# Patient Record
Sex: Female | Born: 1970 | Hispanic: Yes | Marital: Married | Smoking: Current every day smoker
Health system: Southern US, Community
[De-identification: ages and names within clinical notes are randomized; demographics above are authoritative.]

## PROBLEM LIST (undated history)

## (undated) ENCOUNTER — Ambulatory Visit: Source: Home / Self Care

## (undated) DIAGNOSIS — Z789 Other specified health status: Secondary | ICD-10-CM

## (undated) HISTORY — PX: TUBAL LIGATION: SHX77

## (undated) HISTORY — DX: Other specified health status: Z78.9

---

## 2000-01-05 HISTORY — PX: AUGMENTATION MAMMAPLASTY: SUR837

## 2019-02-09 ENCOUNTER — Ambulatory Visit: Payer: Self-pay | Attending: Internal Medicine

## 2019-02-09 DIAGNOSIS — Z20822 Contact with and (suspected) exposure to covid-19: Secondary | ICD-10-CM | POA: Insufficient documentation

## 2019-02-11 LAB — NOVEL CORONAVIRUS, NAA: SARS-CoV-2, NAA: NOT DETECTED

## 2019-03-26 ENCOUNTER — Ambulatory Visit: Payer: Self-pay | Attending: Internal Medicine

## 2019-03-26 DIAGNOSIS — Z20822 Contact with and (suspected) exposure to covid-19: Secondary | ICD-10-CM | POA: Insufficient documentation

## 2019-03-27 LAB — SARS-COV-2, NAA 2 DAY TAT

## 2019-03-27 LAB — NOVEL CORONAVIRUS, NAA: SARS-CoV-2, NAA: NOT DETECTED

## 2019-07-21 ENCOUNTER — Encounter (HOSPITAL_COMMUNITY): Payer: Self-pay

## 2019-07-21 ENCOUNTER — Ambulatory Visit (HOSPITAL_COMMUNITY)
Admission: EM | Admit: 2019-07-21 | Discharge: 2019-07-21 | Disposition: A | Discharge status: Home / Self Care | Payer: Self-pay | Attending: Urgent Care | Admitting: Urgent Care

## 2019-07-21 ENCOUNTER — Other Ambulatory Visit: Payer: Self-pay

## 2019-07-21 DIAGNOSIS — M62830 Muscle spasm of back: Secondary | ICD-10-CM

## 2019-07-21 DIAGNOSIS — M2569 Stiffness of other specified joint, not elsewhere classified: Secondary | ICD-10-CM

## 2019-07-21 DIAGNOSIS — M545 Low back pain, unspecified: Secondary | ICD-10-CM

## 2019-07-21 LAB — POCT URINALYSIS DIP (DEVICE)
Bilirubin Urine: NEGATIVE
Glucose, UA: NEGATIVE mg/dL
Hgb urine dipstick: NEGATIVE
Leukocytes,Ua: NEGATIVE
Nitrite: NEGATIVE
Protein, ur: 30 mg/dL — AB
Specific Gravity, Urine: 1.02 (ref 1.005–1.030)
Urobilinogen, UA: 0.2 mg/dL (ref 0.0–1.0)
pH: 7.5 (ref 5.0–8.0)

## 2019-07-21 MED ORDER — TIZANIDINE HCL 4 MG PO TABS: 4.0000 mg | ORAL_TABLET | Freq: Three times a day (TID) | ORAL | 0 refills | Status: DC | PRN

## 2019-07-21 MED ORDER — NAPROXEN 500 MG PO TABS: 500.0000 mg | ORAL_TABLET | Freq: Two times a day (BID) | ORAL | 0 refills | Status: DC

## 2019-07-21 NOTE — ED Triage Notes (Signed)
Pt presents with lower back pain radiates to lower abdomen. Denies trauma. States ibuprofen did not relief the pain.

## 2019-07-21 NOTE — ED Provider Notes (Signed)
[facility]   MRN: [id number] DOB: 10/29/1970  Subjective:   Peggy Mccarthy is a 49 y.o. female presenting for left lower back pain that radiates anteriorly toward her flank side.  Denies falls, trauma.  Denies fever, nausea, vomiting, dysuria, urinary frequency, cloudy urine, history kidney stone.  Denies doing a lot of heavy lifting.  Has used ibuprofen without relief.  She is not currently taking any medications and has no known food or drug allergies.  Denies past medical and surgical history.  History reviewed. No pertinent family history.  Social History   Tobacco Use  . Smoking status: Never Smoker  . Smokeless tobacco: Never Used  Substance Use Topics  . Alcohol use: Not on file  . Drug use: Never    ROS   Objective:   Vitals: BP (!) 138/98 (BP Location: Right Arm)   Pulse 98   Temp 98.4 F (36.9 C) (Oral)   Resp 17   LMP 07/18/2019 (Exact Date)   SpO2 99%   Physical Exam Constitutional:      General: She is not in acute distress.    Appearance: Normal appearance. She is well-developed. She is not ill-appearing.  HENT:     Head: Normocephalic and atraumatic.     Nose: Nose normal.     Mouth/Throat:     Mouth: Mucous membranes are moist.     Pharynx: Oropharynx is clear.  Eyes:     General: No scleral icterus.       Right eye: No discharge.        Left eye: No discharge.     Extraocular Movements: Extraocular movements intact.     Pupils: Pupils are equal, round, and reactive to light.  Cardiovascular:     Rate and Rhythm: Normal rate.  Pulmonary:     Effort: Pulmonary effort is normal.  Abdominal:     General: Bowel sounds are normal. There is no distension.     Palpations: Abdomen is soft. There is no mass.     Tenderness: There is no abdominal tenderness. There is no right CVA tenderness, left CVA tenderness, guarding or rebound.  Skin:    General: Skin is warm and dry.  Neurological:     General: No focal deficit present.      Mental Status: She is alert and oriented to person, place, and time.  Psychiatric:        Mood and Affect: Mood normal.        Behavior: Behavior normal.        Thought Content: Thought content normal.        Judgment: Judgment normal.     Results for orders placed or performed during the hospital encounter of 07/21/19 (from the past 24 hour(s))  POCT urinalysis dip (device)     Status: Abnormal   Collection Time: 07/21/19  6:53 PM  Result Value Ref Range   Glucose, UA NEGATIVE NEGATIVE mg/dL   Bilirubin Urine NEGATIVE NEGATIVE   Ketones, ur TRACE (A) NEGATIVE mg/dL   Specific Gravity, Urine 1.020 1.005 - 1.030   Hgb urine dipstick NEGATIVE NEGATIVE   pH 7.5 5.0 - 8.0   Protein, ur 30 (A) NEGATIVE mg/dL   Urobilinogen, UA 0.2 0.0 - 1.0 mg/dL   Nitrite NEGATIVE NEGATIVE   Leukocytes,Ua NEGATIVE NEGATIVE    Assessment and Plan :   PDMP not reviewed this encounter.  1. Acute left-sided low back pain without sciatica   2. Muscle spasm of back   3. Back  stiffness     Suspect musculoskeletal pain of her back.  Recommended conservative management with naproxen and tizanidine. Counseled patient on potential for adverse effects with medications prescribed/recommended today, ER and return-to-clinic precautions discussed, patient verbalized understanding.    Wallis Bamberg, [place] 07/21/19 1857

## 2019-10-20 ENCOUNTER — Other Ambulatory Visit: Payer: Self-pay

## 2019-10-20 DIAGNOSIS — Z20822 Contact with and (suspected) exposure to covid-19: Secondary | ICD-10-CM

## 2019-10-22 LAB — SARS-COV-2, NAA 2 DAY TAT

## 2019-10-22 LAB — NOVEL CORONAVIRUS, NAA: SARS-CoV-2, NAA: NOT DETECTED

## 2020-04-01 ENCOUNTER — Ambulatory Visit (INDEPENDENT_AMBULATORY_CARE_PROVIDER_SITE_OTHER): Payer: Self-pay | Admitting: Family

## 2020-04-01 ENCOUNTER — Other Ambulatory Visit: Payer: Self-pay

## 2020-04-01 ENCOUNTER — Encounter: Payer: Self-pay | Admitting: Family

## 2020-04-01 ENCOUNTER — Other Ambulatory Visit: Payer: Self-pay | Admitting: Family

## 2020-04-01 VITALS — BP 120/75 | HR 78 | Ht 60.0 in | Wt 150.8 lb

## 2020-04-01 DIAGNOSIS — M5431 Sciatica, right side: Secondary | ICD-10-CM

## 2020-04-01 DIAGNOSIS — Z716 Tobacco abuse counseling: Secondary | ICD-10-CM

## 2020-04-01 DIAGNOSIS — Z7689 Persons encountering health services in other specified circumstances: Secondary | ICD-10-CM

## 2020-04-01 MED ORDER — GABAPENTIN 100 MG PO CAPS: 100.0000 mg | ORAL_CAPSULE | Freq: Every day | ORAL | 0 refills | Status: DC

## 2020-04-01 MED ORDER — VARENICLINE TARTRATE 0.5 MG PO TABS: ORAL_TABLET | ORAL | 0 refills | Status: AC

## 2020-04-01 NOTE — Progress Notes (Signed)
Establish care Has not been to provider in approx. 6 yrs  Pt experiencing shooting nerve pain from right lower back radiates down leg

## 2020-04-01 NOTE — Progress Notes (Signed)
[facility]ae  Subjective:    Peggy Mccarthy - 50 y.o. female MRN [id number]  Date of birth: 01/21/1970  HPI  Peggy Mccarthy is to establish care. Patient has a PMH significant for sciatic nerve pain, right.   Current issues and/or concerns: 1. SCIATIC NERVE PAIN: Right leg sciatic nerve pain for years, worsening over the past few months. Described as sharp pain from right buttock to the right toe. Does not radiate to back. Right knee swelling sometimes. Standing makes worse. Standing a lot at home. Was taking Naproxen but caused stomach irritation even with food. Has not seen specialist.  2. SMOKING CESSATION: Smoking Amount: 1 pack every 2 days  Smoking Onset: 20 years ago Smoking triggers: social settings, anxiety Type of tobacco use: cigarettes Children in the house: yes Other household members who smoke: no Comments: Would like to begin Chantix   ROS per HPI    Health Maintenance:  Health Maintenance Due  Topic Date Due  . Hepatitis C Screening  Never done  . COVID-19 Vaccine (1) Never done  . HIV Screening  Never done  . TETANUS/TDAP  Never done  . PAP SMEAR-Modifier  Never done  . COLONOSCOPY (Pts 45-60yrs Insurance coverage will need to be confirmed)  Never done  . INFLUENZA VACCINE  Never done  . MAMMOGRAM  Never done    Past Medical History: Patient Active Problem List   Diagnosis Date Noted  . Sciatic nerve pain, right 04/01/2020    Social History   reports that she has been smoking cigarettes. She has smoked for the past 20.00 years. She has never used smokeless tobacco. She reports current alcohol use of about 4.0 standard drinks of alcohol per week. She reports that she does not use drugs.   Family History  family history is not on file.   Medications: reviewed and updated   Objective:   Physical Exam BP 120/75 (BP Location: Left Arm, Patient Position: Sitting)   Pulse 78   Ht 5' (1.524 m)   Wt 150 lb 12.8 oz (68.4 kg)   SpO2 98%   BMI 29.45 kg/m   Physical Exam HENT:     Head: Normocephalic and atraumatic.  Eyes:     Extraocular Movements: Extraocular movements intact.     Conjunctiva/sclera: Conjunctivae normal.     Pupils: Pupils are equal, round, and reactive to light.  Cardiovascular:     Rate and Rhythm: Normal rate and regular rhythm.     Pulses: Normal pulses.     Heart sounds: Normal heart sounds.  Pulmonary:     Effort: Pulmonary effort is normal.     Breath sounds: Normal breath sounds.  Musculoskeletal:     Cervical back: Normal range of motion and neck supple.  Neurological:     General: No focal deficit present.     Mental Status: She is alert and oriented to person, place, and time.  Psychiatric:        Mood and Affect: Mood normal.        Behavior: Behavior normal.      Assessment & Plan:  1. Encounter to establish care: - Patient presents today to establish care.  - Return for annual physical examination, labs, and health maintenance. Arrive fasting meaning having had no food and/or nothing to drink for at least 8 hours prior to appointment.  Please take scheduled medications as normal.  2. Sciatic nerve pain, right: - Gabapentin as prescribed. May cause drowsiness. Counseled patient to not consume if operating heavy  machinery or driving. Counseled patient to not consume with alcohol. - Follow-up with primary provider as scheduled. - gabapentin (NEURONTIN) 100 MG capsule; Take 1 capsule (100 mg total) by mouth at bedtime.  Dispense: 30 capsule; Refill: 0  3. Encounter for smoking cessation counseling: - Varenicline as prescribed. Discussed potential side effects and to notify provider and seek immediate medical attention. Patient verbalized understanding. - Follow-up with primary provider in 4 weeks or sooner if needed. - varenicline (CHANTIX) 0.5 MG tablet; Take 1 tablet (0.5 mg total) by mouth daily for 3 days, THEN 1 tablet (0.5 mg total) 2 (two) times daily for 4 days, THEN 2 tablets (1 mg total) 2  (two) times daily for 23 days.  Dispense: 103 tablet; Refill: 0   Patient was given clear instructions to go to Emergency Department or return to medical center if symptoms don't improve, worsen, or new problems develop.The patient verbalized understanding.  I discussed the assessment and treatment plan with the patient. The patient was provided an opportunity to ask questions and all were answered. The patient agreed with the plan and demonstrated an understanding of the instructions.   The patient was advised to call back or seek an in-person evaluation if the symptoms worsen or if the condition fails to improve as anticipated.    Ricky Stabs, NP 04/01/2020, 1:59 PM Primary Care at [facility]

## 2020-04-01 NOTE — Patient Instructions (Signed)
Return for annual physical examination, labs, and health maintenance. Arrive fasting meaning having had no food and/or nothing to drink for at least 8 hours prior to appointment.  Please take scheduled medications as normal. Thank you for choosing Primary Care at [facility] for your medical home!    Peggy Mccarthy was seen by Rema Fendt, NP today.   Peggy Mccarthy's primary care provider is Rema Fendt, NP.   For the best care possible,  you should try to see Peggy Stabs, NP whenever you come to clinic.   We look forward to seeing you again soon!  If you have any questions about your visit today,  please call us at [phone]  Or feel free to reach your provider via MyChart.   Varenicline oral tablets What is this medicine? VARENICLINE (var e NI kleen) is used to help people quit smoking. It is used with a patient support program recommended by your physician. This medicine may be used for other purposes; ask your health care provider or pharmacist if you have questions. COMMON BRAND NAME(S): Chantix What should I tell my health care provider before I take this medicine? They need to know if you have any of these conditions:  heart disease  if you often drink alcohol  kidney disease  mental illness  on hemodialysis  seizures  history of stroke  suicidal thoughts, plans, or attempt; a previous suicide attempt by you or a family member  an unusual or allergic reaction to varenicline, other medicines, foods, dyes, or preservatives  pregnant or trying to get pregnant  breast-feeding How should I use this medicine? Take this medicine by mouth after eating. Take with a full glass of water. Follow the directions on the prescription label. Take your doses at regular intervals. Do not take your medicine more often than directed. There are 3 ways you can use this medicine to help you quit smoking; talk to your health care professional to decide which plan is  right for you: 1) you can choose a quit date and start this medicine 1 week before the quit date, or, 2) you can start taking this medicine before you choose a quit date, and then pick a quit date between day 8 and 35 days of treatment, or, 3) if you are not sure that you are able or willing to quit smoking right away, start taking this medicine and slowly decrease the amount you smoke as directed by your health care professional with the goal of being cigarette-free by week 12 of treatment. Stick to your plan; ask about support groups or other ways to help you remain cigarette-free. If you are motivated to quit smoking and did not succeed during a previous attempt with this medicine for reasons other than side effects, or if you returned to smoking after this treatment, speak with your health care professional about whether another course of this medicine may be right for you. A special MedGuide will be given to you by the pharmacist with each prescription and refill. Be sure to read this information carefully each time. Talk to your pediatrician regarding the use of this medicine in children. This medicine is not approved for use in children. Overdosage: If you think you have taken too much of this medicine contact a poison control center or emergency room at once. NOTE: This medicine is only for you. Do not share this medicine with others. What if I miss a dose? If you miss a dose, take it as  soon as you can. If it is almost time for your next dose, take only that dose. Do not take double or extra doses. What may interact with this medicine?  alcohol  insulin  other medicines used to help people quit smoking  theophylline  warfarin This list may not describe all possible interactions. Give your health care provider a list of all the medicines, herbs, non-prescription drugs, or dietary supplements you use. Also tell them if you smoke, drink alcohol, or use illegal drugs. Some items may  interact with your medicine. What should I watch for while using this medicine? It is okay if you do not succeed at your attempt to quit and have a cigarette. You can still continue your quit attempt and keep using this medicine as directed. Just throw away your cigarettes and get back to your quit plan. Talk to your health care provider before using other treatments to quit smoking. Using this medicine with other treatments to quit smoking may increase the risk for side effects compared to using a treatment alone. You may get drowsy or dizzy. Do not drive, use machinery, or do anything that needs mental alertness until you know how this medicine affects you. Do not stand or sit up quickly, especially if you are an older patient. This reduces the risk of dizzy or fainting spells. Decrease the number of alcoholic beverages that you drink during treatment with this medicine until you know if this medicine affects your ability to tolerate alcohol. Some people have experienced increased drunkenness (intoxication), unusual or sometimes aggressive behavior, or no memory of things that have happened (amnesia) during treatment with this medicine. Sleepwalking can happen during treatment with this medicine, and can sometimes lead to behavior that is harmful to you, other people, or property. Stop taking this medicine and tell your doctor if you start sleepwalking or have other unusual sleep-related activity. After taking this medicine, you may get up out of bed and do an activity that you do not know you are doing. The next morning, you may have no memory of this. Activities include driving a car ("sleep-driving"), making and eating food, talking on the phone, sexual activity, and sleep-walking. Serious injuries have occurred. Stop the medicine and call your doctor right away if you find out you have done any of these activities. Do not take this medicine if you have used alcohol that evening. Do not take it if you  have taken another medicine for sleep. The risk of doing these sleep-related activities is higher. Patients and their families should watch out for new or worsening depression or thoughts of suicide. Also watch out for sudden changes in feelings such as feeling anxious, agitated, panicky, irritable, hostile, aggressive, impulsive, severely restless, overly excited and hyperactive, or not being able to sleep. If this happens, call your health care professional. If you have diabetes and you quit smoking, the effects of insulin may be increased and you may need to reduce your insulin dose. Check with your doctor or health care professional about how you should adjust your insulin dose. What side effects may I notice from receiving this medicine? Side effects that you should report to your doctor or health care professional as soon as possible:  allergic reactions like skin rash, itching or hives, swelling of the face, lips, tongue, or throat  acting aggressive, being angry or violent, or acting on dangerous impulses  breathing problems  changes in emotions or moods  chest pain or chest tightness  feeling faint  or lightheaded, falls  hallucination, loss of contact with reality  mouth sores  redness, blistering, peeling or loosening of the skin, including inside the mouth  signs and symptoms of a stroke like changes in vision; confusion; trouble speaking or understanding; severe headaches; sudden numbness or weakness of the face, arm or leg; trouble walking; dizziness; loss of balance or coordination  seizures  sleepwalking  suicidal thoughts or other mood changes Side effects that usually do not require medical attention (report to your doctor or health care professional if they continue or are bothersome):  constipation  gas  headache  nausea, vomiting  strange dreams  trouble sleeping This list may not describe all possible side effects. Call your doctor for medical advice  about side effects. You may report side effects to FDA at 1-800-FDA-1088. Where should I keep my medicine? Keep out of the reach of children. Store at room temperature between 15 and 30 degrees C (59 and 86 degrees F). Throw away any unused medicine after the expiration date. NOTE: This sheet is a summary. It may not cover all possible information. If you have questions about this medicine, talk to your doctor, pharmacist, or health care provider.  2021 Elsevier/Gold Standard (2017-12-09 14:27:36)

## 2020-05-05 NOTE — Progress Notes (Signed)
Patient ID: Peggy Mccarthy, female    DOB: 1970/09/01  MRN: [id number]  CC: Annual Physical Exam  Subjective: Peggy Mccarthy is a 50 y.o. female who presents for annual physical exam. Her concerns today include:   1. SCIATIC NERVE PAIN RIGHT FOLLOW-UP: 04/01/2020: - Gabapentin as prescribed.   05/06/2020: Reports took Gabapentin for 1 day and did not like the loopy feeling. Using over-the-counter pain management supplements as needed. Pain is worsening. Requesting MRI.  2. URINARY SYMPTOMS: Concern for UTI. Has abnormal sensation at the end of urinary stream.    Patient Active Problem List   Diagnosis Date Noted  . Sciatic nerve pain, right 04/01/2020     Current Outpatient Medications on File Prior to Visit  Medication Sig Dispense Refill  . gabapentin (NEURONTIN) 100 MG capsule Take 1 capsule (100 mg total) by mouth at bedtime. 30 capsule 0   No current facility-administered medications on file prior to visit.    No Known Allergies  Social History   Socioeconomic History  . Marital status: Married    Spouse name: Not on file  . Number of children: Not on file  . Years of education: Not on file  . Highest education level: Not on file  Occupational History  . Not on file  Tobacco Use  . Smoking status: Current Every Day Smoker    Years: 20.00    Types: Cigarettes  . Smokeless tobacco: Never Used  Vaping Use  . Vaping Use: Never used  Substance and Sexual Activity  . Alcohol use: Yes    Alcohol/week: 4.0 standard drinks    Types: 4 Standard drinks or equivalent per week  . Drug use: Never  . Sexual activity: Yes    Birth control/protection: None  Other Topics Concern  . Not on file  Social History Narrative  . Not on file   Social Determinants of Health   Financial Resource Strain: Not on file  Food Insecurity: Not on file  Transportation Needs: Not on file  Physical Activity: Not on file  Stress: Not on file  Social Connections: Not on file   Intimate Partner Violence: Not on file    No family history on file.  Past Surgical History:  Procedure Laterality Date  . CESAREAN SECTION    . TUBAL LIGATION      ROS: Review of Systems Negative except as stated above  PHYSICAL EXAM: BP (!) 143/92 (BP Location: Left Arm, Patient Position: Sitting)   Pulse 73   Ht 5' (1.524 m)   Wt 150 lb 6.4 oz (68.2 kg)   SpO2 98%   BMI 29.37 kg/m   Physical Exam HENT:     Head: Normocephalic and atraumatic.     Right Ear: Tympanic membrane, ear canal and external ear normal.     Left Ear: Tympanic membrane, ear canal and external ear normal.     Nose: Nose normal.     Mouth/Throat:     Mouth: Mucous membranes are moist.     Pharynx: Oropharynx is clear.  Eyes:     Extraocular Movements: Extraocular movements intact.     Conjunctiva/sclera: Conjunctivae normal.     Pupils: Pupils are equal, round, and reactive to light.  Cardiovascular:     Rate and Rhythm: Normal rate and regular rhythm.     Pulses: Normal pulses.     Heart sounds: Normal heart sounds.  Pulmonary:     Effort: Pulmonary effort is normal.     Breath sounds: Normal  breath sounds.  Chest:     Comments: Patient declined examination. Abdominal:     General: Bowel sounds are normal.     Palpations: Abdomen is soft.  Genitourinary:    Comments: Patient declined examination.  Musculoskeletal:        General: Normal range of motion.     Cervical back: Normal range of motion and neck supple.  Skin:    General: Skin is warm and dry.     Capillary Refill: Capillary refill takes less than 2 seconds.  Neurological:     General: No focal deficit present.     Mental Status: She is alert and oriented to person, place, and time.  Psychiatric:        Mood and Affect: Mood normal.        Behavior: Behavior normal.     ASSESSMENT AND PLAN: 1. Annual physical exam: - Counseled on 150 minutes of exercise per week as tolerated, healthy eating (including decreased  daily intake of saturated fats, cholesterol, added sugars, sodium), STI prevention, and routine healthcare maintenance.  2. Screening for metabolic disorder: - CMP to check kidney function, liver function, and electrolyte balance.  - Comprehensive metabolic panel  3. Screening for deficiency anemia: - CBC to screen for anemia. - CBC  4. Diabetes mellitus screening: - Hemoglobin A1c to screen for pre-diabetes/diabetes. - Hemoglobin A1c  5. Screening cholesterol level: - Lipid panel to screen for high cholesterol.  - Lipid panel  6. Thyroid disorder screen: - TSH to check thyroid function.  - TSH+T4F+T3Free  7. Need for hepatitis C screening test: - Hepatitis C antibody to screen for hepatitis C.  - Hepatitis C Antibody  8. Encounter for screening for HIV: - HIV antibody to screen for human immunodeficiency virus.  - HIV antibody (with reflex)  9. Encounter for screening mammogram for malignant neoplasm of breast: - Referral for breast cancer screening by mammogram.  - MM Digital Screening; Future  10. Pap smear for cervical cancer screening: - Referral to Gynecology for cervical cancer screening by PAP smear.  - Ambulatory referral to Gynecology  11. Colon cancer screening: - Referral to Gastroenterology for colon cancer screening by colonoscopy. - Ambulatory referral to Gastroenterology  12. Sciatic nerve pain, right: - Gabapentin discontinued per patient preference. - Over-the-counter NSAID's such as Ibuprofen, Aleve, or Motrin for pain management. May alternate with over-the-counter Acetaminophen.  - Per patient request MRI of right leg for further evaluation and management.  - Follow-up with primary provider as scheduled. - MR FEMUR RIGHT W WO CONTRAST; Future - MR TIBIA FIBULA LEFT W WO CONTRAST; Future  13. Acute cystitis without hematuria: - Urinalysis positive for urinary tract infection. - Nitrofurantoin, Macrocrystal-Monohydrate as prescribed.  -  Follow-up with primary provider as scheduled.  - POCT URINALYSIS DIP (CLINITEK) - nitrofurantoin, macrocrystal-monohydrate, (MACROBID) 100 MG capsule; Take 1 capsule (100 mg total) by mouth 2 (two) times daily for 5 days.  Dispense: 10 capsule; Refill: 0   Patient was given the opportunity to ask questions.  Patient verbalized understanding of the plan and was able to repeat key elements of the plan. Patient was given clear instructions to go to Emergency Department or return to medical center if symptoms don't improve, worsen, or new problems develop.The patient verbalized understanding.   Orders Placed This Encounter  Procedures  . MM Digital Screening  . MR FEMUR RIGHT W WO CONTRAST  . MR TIBIA FIBULA LEFT W WO CONTRAST  . Hepatitis C Antibody  . HIV  antibody (with reflex)  . CBC  . Comprehensive metabolic panel  . Lipid panel  . TSH+T4F+T3Free  . Hemoglobin A1c  . Ambulatory referral to Gynecology  . Ambulatory referral to Gastroenterology  . POCT URINALYSIS DIP (CLINITEK)     Requested Prescriptions   Signed Prescriptions Disp Refills  . nitrofurantoin, macrocrystal-monohydrate, (MACROBID) 100 MG capsule 10 capsule 0    Sig: Take 1 capsule (100 mg total) by mouth 2 (two) times daily for 5 days.   Follow-up with primary provider as scheduled.   Rema Fendt, NP

## 2020-05-06 ENCOUNTER — Other Ambulatory Visit: Payer: Self-pay

## 2020-05-06 ENCOUNTER — Ambulatory Visit (INDEPENDENT_AMBULATORY_CARE_PROVIDER_SITE_OTHER): Payer: 59 | Admitting: Family

## 2020-05-06 VITALS — BP 143/92 | HR 73 | Ht 60.0 in | Wt 150.4 lb

## 2020-05-06 DIAGNOSIS — Z0001 Encounter for general adult medical examination with abnormal findings: Secondary | ICD-10-CM | POA: Diagnosis not present

## 2020-05-06 DIAGNOSIS — Z1211 Encounter for screening for malignant neoplasm of colon: Secondary | ICD-10-CM

## 2020-05-06 DIAGNOSIS — Z1329 Encounter for screening for other suspected endocrine disorder: Secondary | ICD-10-CM

## 2020-05-06 DIAGNOSIS — Z13228 Encounter for screening for other metabolic disorders: Secondary | ICD-10-CM | POA: Diagnosis not present

## 2020-05-06 DIAGNOSIS — N3000 Acute cystitis without hematuria: Secondary | ICD-10-CM

## 2020-05-06 DIAGNOSIS — Z130 Encounter for screening for diseases of the blood and blood-forming organs and certain disorders involving the immune mechanism: Secondary | ICD-10-CM | POA: Diagnosis not present

## 2020-05-06 DIAGNOSIS — Z0000 Encounter for general adult medical examination without abnormal findings: Secondary | ICD-10-CM

## 2020-05-06 DIAGNOSIS — Z716 Tobacco abuse counseling: Secondary | ICD-10-CM

## 2020-05-06 DIAGNOSIS — Z131 Encounter for screening for diabetes mellitus: Secondary | ICD-10-CM

## 2020-05-06 DIAGNOSIS — M5431 Sciatica, right side: Secondary | ICD-10-CM

## 2020-05-06 DIAGNOSIS — Z114 Encounter for screening for human immunodeficiency virus [HIV]: Secondary | ICD-10-CM

## 2020-05-06 DIAGNOSIS — Z1231 Encounter for screening mammogram for malignant neoplasm of breast: Secondary | ICD-10-CM

## 2020-05-06 DIAGNOSIS — Z124 Encounter for screening for malignant neoplasm of cervix: Secondary | ICD-10-CM

## 2020-05-06 DIAGNOSIS — Z1159 Encounter for screening for other viral diseases: Secondary | ICD-10-CM

## 2020-05-06 DIAGNOSIS — Z13220 Encounter for screening for lipoid disorders: Secondary | ICD-10-CM

## 2020-05-06 LAB — POCT URINALYSIS DIP (CLINITEK)
Bilirubin, UA: NEGATIVE
Glucose, UA: NEGATIVE mg/dL
Ketones, POC UA: NEGATIVE mg/dL
Nitrite, UA: POSITIVE — AB
Spec Grav, UA: 1.025 (ref 1.010–1.025)
Urobilinogen, UA: 0.2 E.U./dL
pH, UA: 6 (ref 5.0–8.0)

## 2020-05-06 MED ORDER — NITROFURANTOIN MONOHYD MACRO 100 MG PO CAPS: 100.0000 mg | ORAL_CAPSULE | Freq: Two times a day (BID) | ORAL | 0 refills | Status: AC

## 2020-05-06 NOTE — Progress Notes (Signed)
Physical Pt request MRI for sciatic nerve pain on right side Pt thinks she has UTI

## 2020-05-06 NOTE — Patient Instructions (Signed)
 Annual physical exam and labs today.   Preventive Care 40-50 Years Old, Female Preventive care refers to lifestyle choices and visits with your health care provider that can promote health and wellness. This includes:  A yearly physical exam. This is also called an annual wellness visit.  Regular dental and eye exams.  Immunizations.  Screening for certain conditions.  Healthy lifestyle choices, such as: ? Eating a healthy diet. ? Getting regular exercise. ? Not using drugs or products that contain nicotine and tobacco. ? Limiting alcohol use. What can I expect for my preventive care visit? Physical exam Your health care provider will check your:  Height and weight. These may be used to calculate your BMI (body mass index). BMI is a measurement that tells if you are at a healthy weight.  Heart rate and blood pressure.  Body temperature.  Skin for abnormal spots. Counseling Your health care provider may ask you questions about your:  Past medical problems.  Family's medical history.  Alcohol, tobacco, and drug use.  Emotional well-being.  Home life and relationship well-being.  Sexual activity.  Diet, exercise, and sleep habits.  Work and work environment.  Access to firearms.  Method of birth control.  Menstrual cycle.  Pregnancy history. What immunizations do I need? Vaccines are usually given at various ages, according to a schedule. Your health care provider will recommend vaccines for you based on your age, medical history, and lifestyle or other factors, such as travel or where you work.   What tests do I need? Blood tests  Lipid and cholesterol levels. These may be checked every 5 years, or more often if you are over 50 years old.  Hepatitis C test.  Hepatitis B test. Screening  Lung cancer screening. You may have this screening every year starting at age 55 if you have a 30-pack-year history of smoking and currently smoke or have quit within  the past 15 years.  Colorectal cancer screening. ? All adults should have this screening starting at age 50 and continuing until age 75. ? Your health care provider may recommend screening at age 45 if you are at increased risk. ? You will have tests every 1-10 years, depending on your results and the type of screening test.  Diabetes screening. ? This is done by checking your blood sugar (glucose) after you have not eaten for a while (fasting). ? You may have this done every 1-3 years.  Mammogram. ? This may be done every 1-2 years. ? Talk with your health care provider about when you should start having regular mammograms. This may depend on whether you have a family history of breast cancer.  BRCA-related cancer screening. This may be done if you have a family history of breast, ovarian, tubal, or peritoneal cancers.  Pelvic exam and Pap test. ? This may be done every 3 years starting at age 21. ? Starting at age 30, this may be done every 5 years if you have a Pap test in combination with an HPV test. Other tests  STD (sexually transmitted disease) testing, if you are at risk.  Bone density scan. This is done to screen for osteoporosis. You may have this scan if you are at high risk for osteoporosis. Talk with your health care provider about your test results, treatment options, and if necessary, the need for more tests. Follow these instructions at home: Eating and drinking  Eat a diet that includes fresh fruits and vegetables, whole grains, lean protein, and   low-fat dairy products.  Take vitamin and mineral supplements as recommended by your health care provider.  Do not drink alcohol if: ? Your health care provider tells you not to drink. ? You are pregnant, may be pregnant, or are planning to become pregnant.  If you drink alcohol: ? Limit how much you have to 0-1 drink a day. ? Be aware of how much alcohol is in your drink. In the [place] one drink equals one 12 oz  bottle of beer (355 mL), one 5 oz glass of wine (148 mL), or one 1 oz glass of hard liquor (44 mL).   Lifestyle  Take daily care of your teeth and gums. Brush your teeth every morning and night with fluoride toothpaste. Floss one time each day.  Stay active. Exercise for at least 30 minutes 5 or more days each week.  Do not use any products that contain nicotine or tobacco, such as cigarettes, e-cigarettes, and chewing tobacco. If you need help quitting, ask your health care provider.  Do not use drugs.  If you are sexually active, practice safe sex. Use a condom or other form of protection to prevent STIs (sexually transmitted infections).  If you do not wish to become pregnant, use a form of birth control. If you plan to become pregnant, see your health care provider for a prepregnancy visit.  If told by your health care provider, take low-dose aspirin daily starting at age 50.  Find healthy ways to cope with stress, such as: ? Meditation, yoga, or listening to music. ? Journaling. ? Talking to a trusted person. ? Spending time with friends and family. Safety  Always wear your seat belt while driving or riding in a vehicle.  Do not drive: ? If you have been drinking alcohol. Do not ride with someone who has been drinking. ? When you are tired or distracted. ? While texting.  Wear a helmet and other protective equipment during sports activities.  If you have firearms in your house, make sure you follow all gun safety procedures. What's next?  Visit your health care provider once a year for an annual wellness visit.  Ask your health care provider how often you should have your eyes and teeth checked.  Stay up to date on all vaccines. This information is not intended to replace advice given to you by your health care provider. Make sure you discuss any questions you have with your health care provider. Document Revised: 09/25/2019 Document Reviewed: 09/01/2017 Elsevier  Patient Education  2021 Elsevier Inc.  

## 2020-05-07 LAB — CBC
Hematocrit: 37.9 % (ref 34.0–46.6)
Hemoglobin: 13.2 g/dL (ref 11.1–15.9)
MCH: 34.4 pg — ABNORMAL HIGH (ref 26.6–33.0)
MCHC: 34.8 g/dL (ref 31.5–35.7)
MCV: 99 fL — ABNORMAL HIGH (ref 79–97)
Platelets: 369 10*3/uL (ref 150–450)
RBC: 3.84 x10E6/uL (ref 3.77–5.28)
RDW: 13 % (ref 11.7–15.4)
WBC: 6.4 10*3/uL (ref 3.4–10.8)

## 2020-05-07 LAB — HEPATITIS C ANTIBODY: Hep C Virus Ab: 0.2 s/co ratio (ref 0.0–0.9)

## 2020-05-07 LAB — TSH+T4F+T3FREE
Free T4: 0.97 ng/dL (ref 0.82–1.77)
T3, Free: 2.9 pg/mL (ref 2.0–4.4)
TSH: 1.67 u[IU]/mL (ref 0.450–4.500)

## 2020-05-07 LAB — COMPREHENSIVE METABOLIC PANEL
ALT: 64 IU/L — ABNORMAL HIGH (ref 0–32)
AST: 81 IU/L — ABNORMAL HIGH (ref 0–40)
Albumin/Globulin Ratio: 1.4 (ref 1.2–2.2)
Albumin: 3.9 g/dL (ref 3.8–4.8)
Alkaline Phosphatase: 76 IU/L (ref 44–121)
BUN/Creatinine Ratio: 15 (ref 9–23)
BUN: 9 mg/dL (ref 6–24)
Bilirubin Total: 0.2 mg/dL (ref 0.0–1.2)
CO2: 26 mmol/L (ref 20–29)
Calcium: 9.4 mg/dL (ref 8.7–10.2)
Chloride: 99 mmol/L (ref 96–106)
Creatinine, Ser: 0.62 mg/dL (ref 0.57–1.00)
Globulin, Total: 2.8 g/dL (ref 1.5–4.5)
Glucose: 88 mg/dL (ref 65–99)
Potassium: 4.2 mmol/L (ref 3.5–5.2)
Sodium: 139 mmol/L (ref 134–144)
Total Protein: 6.7 g/dL (ref 6.0–8.5)
eGFR: 108 mL/min/{1.73_m2} (ref >59)

## 2020-05-07 LAB — LIPID PANEL
Chol/HDL Ratio: 2.9 ratio (ref 0.0–4.4)
Cholesterol, Total: 154 mg/dL (ref 100–199)
HDL: 53 mg/dL (ref >39)
LDL Chol Calc (NIH): 90 mg/dL (ref 0–99)
Triglycerides: 55 mg/dL (ref 0–149)
VLDL Cholesterol Cal: 11 mg/dL (ref 5–40)

## 2020-05-07 LAB — HEMOGLOBIN A1C
Est. average glucose Bld gHb Est-mCnc: 111 mg/dL
Hgb A1c MFr Bld: 5.5 % (ref 4.8–5.6)

## 2020-05-07 LAB — HIV ANTIBODY (ROUTINE TESTING W REFLEX): HIV Screen 4th Generation wRfx: NONREACTIVE

## 2020-05-07 NOTE — Progress Notes (Signed)
Kidney function normal.   Thyroid function normal.   No anemia.   No diabetes.   Cholesterol normal.   Hepatitis C negative.   HIV negative.   AST/ALT are both elevated. These are used to check liver function.   Some causes of an elevation of AST/ALT are increased alcohol consumption, high fat diet, and overuse of NSAID's such as Ibuprofen, Aleve, Motrin, and Naproxen. Patient encouraged to have rechecked in 8 weeks for lab only visit.

## 2020-05-15 ENCOUNTER — Telehealth: Payer: Self-pay | Admitting: Family

## 2020-05-15 NOTE — Telephone Encounter (Signed)
Pre-cert for MRI is pending

## 2020-05-15 NOTE — Telephone Encounter (Signed)
Peggy Mccarthy from [facility] [phone] ext [phone] called stating pt needs pre cert from insurance company for her MRI appointments on 05/21/20.

## 2020-05-16 ENCOUNTER — Other Ambulatory Visit: Payer: Self-pay | Admitting: Family

## 2020-05-16 ENCOUNTER — Telehealth: Payer: Self-pay | Admitting: Family

## 2020-05-16 DIAGNOSIS — M5431 Sciatica, right side: Secondary | ICD-10-CM

## 2020-05-16 NOTE — Telephone Encounter (Signed)
MRI request Denied by [facility] did not deem medically necessary

## 2020-05-16 NOTE — Telephone Encounter (Signed)
MRI pre-cert started [facility] denied MRI procedure not medically necessary, suggest xray

## 2020-05-16 NOTE — Telephone Encounter (Signed)
MRI's canceled.  Updated to right lower extremity x-rays as recommended per [facility].

## 2020-05-21 ENCOUNTER — Ambulatory Visit (HOSPITAL_COMMUNITY): Payer: 59

## 2020-05-26 NOTE — Telephone Encounter (Signed)
Shawna Orleans from [facility] [phone] ext [phone] called stating pt has 2 MRI's that need pre certs from pt's ins.

## 2020-05-27 ENCOUNTER — Ambulatory Visit (HOSPITAL_COMMUNITY): Admission: RE | Admit: 2020-05-27 | Payer: 59 | Source: Ambulatory Visit

## 2020-05-27 ENCOUNTER — Other Ambulatory Visit: Payer: Self-pay | Admitting: Family

## 2020-05-27 DIAGNOSIS — M5431 Sciatica, right side: Secondary | ICD-10-CM

## 2020-05-27 NOTE — Telephone Encounter (Signed)
PCP ordered MRI Lumbar spine w/o contrast

## 2020-05-28 ENCOUNTER — Ambulatory Visit (HOSPITAL_COMMUNITY)
Admission: RE | Admit: 2020-05-28 | Discharge: 2020-05-28 | Disposition: A | Discharge status: Home / Self Care | Payer: 59 | Source: Ambulatory Visit | Attending: Family | Admitting: Family

## 2020-05-28 ENCOUNTER — Ambulatory Visit (HOSPITAL_COMMUNITY): Payer: 59

## 2020-05-28 ENCOUNTER — Telehealth: Payer: Self-pay | Admitting: Family

## 2020-05-28 ENCOUNTER — Other Ambulatory Visit: Payer: Self-pay | Admitting: Family

## 2020-05-28 ENCOUNTER — Other Ambulatory Visit: Payer: Self-pay

## 2020-05-28 DIAGNOSIS — M5136 Other intervertebral disc degeneration, lumbar region: Secondary | ICD-10-CM

## 2020-05-28 DIAGNOSIS — M5431 Sciatica, right side: Secondary | ICD-10-CM | POA: Insufficient documentation

## 2020-05-28 NOTE — Progress Notes (Signed)
Degenerative/arthritis changes of lower back. Referral to Orthopedic Surgery for further evaluation and management. Their office should call patient within 2 weeks with appointment details.

## 2020-06-11 ENCOUNTER — Ambulatory Visit (INDEPENDENT_AMBULATORY_CARE_PROVIDER_SITE_OTHER): Payer: 59 | Admitting: Orthopaedic Surgery

## 2020-06-11 ENCOUNTER — Ambulatory Visit: Payer: Self-pay

## 2020-06-11 ENCOUNTER — Encounter: Payer: Self-pay | Admitting: Orthopaedic Surgery

## 2020-06-11 VITALS — BP 157/83 | HR 65 | Ht 60.0 in | Wt 151.0 lb

## 2020-06-11 DIAGNOSIS — M545 Low back pain, unspecified: Secondary | ICD-10-CM

## 2020-06-11 DIAGNOSIS — M5459 Other low back pain: Secondary | ICD-10-CM

## 2020-06-11 DIAGNOSIS — M48062 Spinal stenosis, lumbar region with neurogenic claudication: Secondary | ICD-10-CM | POA: Diagnosis not present

## 2020-06-11 NOTE — Progress Notes (Signed)
Office Visit Note   Patient: Peggy Mccarthy           Date of Birth: 1970/10/01           MRN: [id number] Visit Date: 06/11/2020              Requested by: Peggy Fendt, NP [address] [place],  [place] [address] PCP: Peggy Fendt, NP   Assessment & Plan: Visit Diagnoses:  1. Acute right-sided low back pain, unspecified whether sciatica present     Plan: Patient with neurogenic claudication symptoms primarily right-sided symptoms.  We discussed with her that she is unlikely to get this significant or any relief with an epidural steroid since her problem is severe spinal stenosis.  She does not have any instability and no spondylolisthesis.  She needs single level decompression surgery with laminectomy at L4 and top portion of L5.  Use of the operative microscope and overnight stay in the hospital discussed.  She limited from lifting after the surgery for several weeks and will do daily walking.  Risk surgery discussed including dural tear, infection, risk of anesthesia, possible need for fusion if she developed instability later.  Possible problems at other levels later that are not currently present on her MRI scan.  Questions were elicited and answered she understands request to proceed with single level L4-5 lumbar decompression surgery.  Follow-Up Instructions: No follow-ups on file.   Orders:  Orders Placed This Encounter  Procedures   XR Lumbar Spine 2-3 Views   No orders of the defined types were placed in this encounter.     Procedures: No procedures performed   Clinical Data: No additional findings.   Subjective: Chief Complaint  Patient presents with   Lower Back - Pain    HPI 50 year old female referred by Peggy Stabs NP with low back pain.  Patient states she is unable to stand longer ambulate more than a block.  She describes pulsating pain that radiates into the sacral area sometimes in the right leg down to her toes with weakness in her  leg and numbness.  She gets better when she sits.  She has noticed some swelling in her left knee.  Patient had an MRI scan ordered but was denied by her insurance company initially, ultimately approved and done on 05/28/2020 which showed central disc protrusion facet arthropathy ligament infolding and multifactorial severe canal stenosis single level at L4-5.  He states as long as she uses a grocery cart she can make it all the way through the store but without it she can only walk from her car and has to stop and sit if she gets to the checkout area.  Review of Systems negative for gout.  No previous lumbar surgeries.  All other systems noncontributory to HPI.   Objective: Vital Signs: BP (!) 157/83   Pulse 65   Ht 5' (1.524 m)   Wt 151 lb (68.5 kg)   BMI 29.49 kg/m   Physical Exam Constitutional:      Appearance: She is well-developed.  HENT:     Head: Normocephalic.     Right Ear: External ear normal.     Left Ear: External ear normal. There is no impacted cerumen.  Eyes:     Pupils: Pupils are equal, round, and reactive to light.  Neck:     Thyroid: No thyromegaly.     Trachea: No tracheal deviation.  Cardiovascular:     Rate and Rhythm: Normal rate.  Pulmonary:     Effort: Pulmonary effort is normal.  Abdominal:     Palpations: Abdomen is soft.  Musculoskeletal:     Cervical back: No rigidity.  Skin:    General: Skin is warm and dry.  Neurological:     Mental Status: She is alert and oriented to person, place, and time.  Psychiatric:        Behavior: Behavior normal.    Ortho Exam lower extremity pulses are normal.  Knee and ankle jerk 1+ and symmetrical.  Negative logroll to the hips negative straight leg raising 90 degrees negative popliteal compression test no lower extremity atrophy.  Plantar surface of her feet are normal.  No knee effusion noted.  Anterior tib gastrocsoleus is strong she is able to heel and toe walk.  Specialty Comments:  No specialty comments  available.  Imaging: CLINICAL DATA:  Right sciatic nerve pain, low back pain   EXAM: MRI LUMBAR SPINE WITHOUT CONTRAST   TECHNIQUE: Multiplanar, multisequence MR imaging of the lumbar spine was performed. No intravenous contrast was administered.   COMPARISON:  None.   FINDINGS: Motion artifact is present particularly on axial imaging.   Segmentation: Standard.   Alignment:  Anteroposterior alignment is maintained.   Vertebrae: Vertebral body heights are preserved. Mild marrow edema along the left L4 and L5 pedicles may be related to facet arthropathy. No suspicious osseous lesion.   Conus medullaris and cauda equina: Conus extends to the L1-L2 level. Conus and cauda equina appear normal.   Paraspinal and other soft tissues: Unremarkable.   Disc levels: Mild disc desiccation from L3-L4 to L5-S1. Mild disc height loss, greatest at L4-L5.   L1-L2:  No canal or foraminal stenosis.   L2-L3:  No canal or foraminal stenosis.   L3-L4: Small central disc protrusion and annular fissure. Mild facet arthropathy with ligamentum flavum infolding. Mild canal stenosis. No foraminal stenosis.   L4-L5: Disc bulge with superimposed central disc protrusion. Moderate facet arthropathy with ligamentum flavum infolding. Marked canal stenosis. Effacement of subarticular recesses. No right foraminal stenosis. Mild left foraminal stenosis.   L5-S1: Central disc protrusion. Mild facet arthropathy. No canal or foraminal stenosis.   IMPRESSION: Lower lumbar degenerative changes as detailed above. Most notably, there is marked canal stenosis at L4-L5 with effacement of subarticular recesses.     Electronically Signed   By: Guadlupe Spanish M.D.   On: 05/28/2020 16:45z   PMFS History: Patient Active Problem List   Diagnosis Date Noted   Sciatic nerve pain, right 04/01/2020   Past Medical History:  Diagnosis Date   No pertinent past medical history     No family history on file.   Past Surgical History:  Procedure Laterality Date   CESAREAN SECTION     TUBAL LIGATION     Social History   Occupational History   Not on file  Tobacco Use   Smoking status: Current Every Day Smoker    Years: 20.00    Types: Cigarettes   Smokeless tobacco: Never Used  Vaping Use   Vaping Use: Never used  Substance and Sexual Activity   Alcohol use: Yes    Alcohol/week: 4.0 standard drinks    Types: 4 Standard drinks or equivalent per week   Drug use: Never   Sexual activity: Yes    Birth control/protection: None

## 2020-06-16 DIAGNOSIS — M48061 Spinal stenosis, lumbar region without neurogenic claudication: Secondary | ICD-10-CM | POA: Insufficient documentation

## 2020-06-24 ENCOUNTER — Telehealth: Payer: Self-pay | Admitting: Orthopaedic Surgery

## 2020-06-24 DIAGNOSIS — M48062 Spinal stenosis, lumbar region with neurogenic claudication: Secondary | ICD-10-CM

## 2020-06-24 NOTE — Telephone Encounter (Signed)
Pt called now wanting to try injections or any other options before going the surgery route. Has an preop appt for 6/29 with Fayrene Fearing, can she change this appt to see Fayrene Fearing sooner or will pt need to see Dr. Ophelia Charter. Pt also wanted ot know if Injections was an option with the ins that she currently has. The best call back number is [phone].

## 2020-06-24 NOTE — Telephone Encounter (Signed)
Can you please advise? Surgery was denied and per your referral note, you did not feel that PT will help but we could order it. Would you like for me to enter PT or try injection?

## 2020-06-25 NOTE — Telephone Encounter (Signed)
Referral entered  

## 2020-06-26 ENCOUNTER — Other Ambulatory Visit (HOSPITAL_COMMUNITY)
Admission: RE | Admit: 2020-06-26 | Discharge: 2020-06-26 | Disposition: A | Discharge status: Home / Self Care | Payer: 59 | Source: Ambulatory Visit | Attending: Obstetrics and Gynecology | Admitting: Obstetrics and Gynecology

## 2020-06-26 ENCOUNTER — Encounter: Payer: Self-pay | Admitting: Obstetrics and Gynecology

## 2020-06-26 ENCOUNTER — Other Ambulatory Visit: Payer: Self-pay

## 2020-06-26 ENCOUNTER — Ambulatory Visit (INDEPENDENT_AMBULATORY_CARE_PROVIDER_SITE_OTHER): Payer: 59 | Admitting: Obstetrics and Gynecology

## 2020-06-26 VITALS — BP 122/74 | HR 85 | Temp 98.0°F | Ht 60.0 in | Wt 151.0 lb

## 2020-06-26 DIAGNOSIS — Z01419 Encounter for gynecological examination (general) (routine) without abnormal findings: Secondary | ICD-10-CM

## 2020-06-26 DIAGNOSIS — Z113 Encounter for screening for infections with a predominantly sexual mode of transmission: Secondary | ICD-10-CM

## 2020-06-26 NOTE — Progress Notes (Signed)
   GYNECOLOGICAL OFFICE VISIT FOR PAP ONLY Patient name: Peggy Mccarthy MRN [id number]  Date of birth: Oct 12, 1970 Chief Complaint:   Gynecologic Exam and Establish Care  History of Present Illness:   Peggy Mccarthy is a 50 y.o. G34P2012 Hispanic female being seen today for a routine well-woman exam.  Current complaints: none. Has had annual exam with PCP, but needs pap smear and STI testing.  PCP: Ricky Stabs, NP      does not desire labs Patient's last menstrual period was 06/20/2020. The current method of family planning is tubal ligation.  Last pap >5 years ago. Results were:  normal per patient Last mammogram: 5-6 years ago. Results were: normal. Family h/o breast cancer: No Last colonoscopy: unknown. Family h/o colorectal cancer: No Review of Systems:   Pertinent items are noted in HPI Denies any headaches, blurred vision, fatigue, shortness of breath, chest pain, abdominal pain, abnormal vaginal discharge/itching/odor/irritation, problems with periods, bowel movements, urination, or intercourse unless otherwise stated above. Pertinent History Reviewed:  Reviewed past medical,surgical, social and family history.  Reviewed problem list, medications and allergies. Physical Assessment:   Vitals:   06/26/20 1340  BP: 122/74  Pulse: 85  Temp: 98 F (36.7 C)  TempSrc: Oral  Weight: 151 lb (68.5 kg)  Height: 5' (1.524 m)  Body mass index is 29.49 kg/m.        Physical Examination:   General appearance - well appearing, and in no distress  Mental status - alert, oriented to person, place, and time  Psych:  She has a normal mood and affect  Skin - warm and dry, normal color, no suspicious lesions noted  Chest - effort normal, all lung fields clear to auscultation bilaterally  Heart - normal rate and regular rhythm  Neck:  midline trachea, no thyromegaly or nodules  Breasts - breasts appear normal, no suspicious masses, no skin or nipple changes or  axillary nodes  Abdomen -  soft, nontender, nondistended, no masses or organomegaly  Pelvic - VULVA: normal appearing vulva with no masses, tenderness or lesions  VAGINA: normal appearing vagina with normal color and discharge, no lesions  CERVIX: normal appearing cervix without discharge or lesions, no CMT - cervical os mildly stenotic  Thin prep pap is done with HR HPV cotesting  UTERUS: uterus is felt to be normal size, shape, consistency and nontender   ADNEXA: No adnexal masses or tenderness noted.  Rectal - deferred  Extremities:  No swelling or varicosities noted  No results found for this or any previous visit (from the past 24 hour(s)).  Assessment & Plan:  1) Well-Woman Exam with Pap  2) Encounter for routine gynecological examination with Papanicolaou smear of cervix  - Cytology - PAP( [place]),   3) Screen for STD (sexually transmitted disease)  - Cervicovaginal ancillary only( [place])   Labs/procedures today: pap and STI testing  Mammogram scheduled for 07/03/2020  Colonoscopy will schedule or sooner if problems  No orders of the defined types were placed in this encounter.   Meds: No orders of the defined types were placed in this encounter.   Follow-up: Return in about 1 year (around 06/26/2021) for Annual Exam.  Raelyn Mora MSN, CNM 06/26/2020 2:26 PM

## 2020-06-27 ENCOUNTER — Telehealth: Payer: Self-pay

## 2020-06-27 LAB — CERVICOVAGINAL ANCILLARY ONLY
Bacterial Vaginitis (gardnerella): NEGATIVE
Candida Glabrata: NEGATIVE
Candida Vaginitis: NEGATIVE
Chlamydia: NEGATIVE
Comment: NEGATIVE
Comment: NEGATIVE
Comment: NEGATIVE
Comment: NEGATIVE
Comment: NEGATIVE
Comment: NORMAL
Neisseria Gonorrhea: NEGATIVE
Trichomonas: NEGATIVE

## 2020-06-27 NOTE — Telephone Encounter (Signed)
Peggy Mccarthy from [facility] called regarding a outcome for a injection Peggy Mccarthy is requesting a call [phone]

## 2020-06-30 LAB — CYTOLOGY - PAP
Adequacy: ABSENT
Comment: NEGATIVE
Diagnosis: NEGATIVE
High risk HPV: NEGATIVE

## 2020-06-30 NOTE — Telephone Encounter (Signed)
Returned call and pt needs a Audiological scientist.

## 2020-07-01 NOTE — Telephone Encounter (Signed)
Amy, Will you please check on surgery to see if authorized?  Thanks!  Scotti Kosta

## 2020-07-01 NOTE — Telephone Encounter (Signed)
Please see paperwork placed on your desk. Injection has been denied for the same reasons as surgery. Would you like for me to enter referral for PT?

## 2020-07-02 ENCOUNTER — Ambulatory Visit: Payer: 59 | Admitting: Surgery

## 2020-07-02 NOTE — Telephone Encounter (Signed)
See note from Amy.  [id number] is not approved.

## 2020-07-03 ENCOUNTER — Other Ambulatory Visit: Payer: Self-pay

## 2020-07-03 ENCOUNTER — Ambulatory Visit
Admission: RE | Admit: 2020-07-03 | Discharge: 2020-07-03 | Disposition: A | Discharge status: Home / Self Care | Payer: 59 | Source: Ambulatory Visit | Attending: Family | Admitting: Family

## 2020-07-03 DIAGNOSIS — Z1231 Encounter for screening mammogram for malignant neoplasm of breast: Secondary | ICD-10-CM

## 2020-07-08 NOTE — Progress Notes (Signed)
No evidence of malignancy. Repeat in 1 year.  

## 2020-08-05 ENCOUNTER — Encounter: Payer: Self-pay | Admitting: Orthopaedic Surgery

## 2020-08-05 NOTE — Telephone Encounter (Signed)
noted 

## 2020-08-11 NOTE — Telephone Encounter (Signed)
I called patient and advised we did not have information that surgery in total was approved, just the microscope code but not the decompression.  She is going to look for paper she received and get back in touch with me about auth and possibly scheduling.

## 2020-10-06 ENCOUNTER — Emergency Department (HOSPITAL_COMMUNITY): Payer: 59

## 2020-10-06 ENCOUNTER — Emergency Department (HOSPITAL_COMMUNITY)
Admission: EM | Admit: 2020-10-06 | Discharge: 2020-10-06 | Disposition: A | Discharge status: Home / Self Care | Payer: 59 | Attending: Emergency Medicine | Admitting: Emergency Medicine

## 2020-10-06 DIAGNOSIS — F17210 Nicotine dependence, cigarettes, uncomplicated: Secondary | ICD-10-CM | POA: Insufficient documentation

## 2020-10-06 DIAGNOSIS — U071 COVID-19: Secondary | ICD-10-CM | POA: Insufficient documentation

## 2020-10-06 DIAGNOSIS — R059 Cough, unspecified: Secondary | ICD-10-CM | POA: Diagnosis present

## 2020-10-06 MED ORDER — NIRMATRELVIR/RITONAVIR (PAXLOVID)TABLET: 3.0000 | ORAL_TABLET | Freq: Two times a day (BID) | ORAL | 0 refills | Status: AC

## 2020-10-06 NOTE — ED Triage Notes (Addendum)
Patient for evaluation after testing positive for COVID today, states her mother who lives with her tested positive over one week ago and the patient's symptoms started over four days ago. Patient alert, oriented, in no apparent distress, speaking in complete sentences, afebrile in triage. Patient is not vaccinated against COVID.

## 2020-10-06 NOTE — ED Notes (Signed)
Pt d/c home per MD order. Discharge summary reviewed with pt, pt verbalizes understanding. Ambulatory off unit. No s/s of acute distress noted.  

## 2020-10-06 NOTE — ED Provider Notes (Signed)
[facility] EMERGENCY DEPARTMENT Provider Note   CSN: [id number] Arrival date & time: 10/06/20  2831     History Chief Complaint  Patient presents with   Covid Positive    Peggy Mccarthy is a 50 y.o. female.  HPI Patient presents with COVID infection.  States she has been coughing.  Some chest tightness.  Some congestion.  States she thinks she got it from mother.  Has had for 5 days of symptoms.  She smokes.  Otherwise healthy.  Able to still do her activities.  No nausea or vomiting.  States she has lost her sense of smell and taste somewhat.    Past Medical History:  Diagnosis Date   No pertinent past medical history     Patient Active Problem List   Diagnosis Date Noted   Spinal stenosis of lumbar region 06/16/2020   Sciatic nerve pain, right 04/01/2020    Past Surgical History:  Procedure Laterality Date   AUGMENTATION MAMMAPLASTY Bilateral 2002   CESAREAN SECTION     TUBAL LIGATION       OB History     Gravida  3   Para  2   Term  2   Preterm      AB  1   Living  2      SAB      IAB      Ectopic      Multiple      Live Births  2           Family History  Problem Relation Age of Onset   Breast cancer Maternal Aunt     Social History   Tobacco Use   Smoking status: Every Day    Years: 20.00    Types: Cigarettes   Smokeless tobacco: Never  Vaping Use   Vaping Use: Never used  Substance Use Topics   Alcohol use: Yes    Alcohol/week: 4.0 standard drinks    Types: 4 Standard drinks or equivalent per week   Drug use: Never    Home Medications Prior to Admission medications   Medication Sig Start Date End Date Taking? Authorizing Provider  nirmatrelvir/ritonavir EUA (PAXLOVID) 20 x 150 MG & 10 x 100MG  TABS Take 3 tablets by mouth 2 (two) times daily for 5 days. Patient GFR is >60. Take nirmatrelvir (150 mg) two tablets twice daily for 5 days and ritonavir (100 mg) one tablet twice daily for 5 days. 10/06/20  10/11/20 Yes 12/11/20, MD    Allergies    Aspirin  Review of Systems   Review of Systems  Constitutional:  Positive for appetite change and fatigue. Negative for fever.  HENT:  Positive for congestion.   Respiratory:  Positive for cough and chest tightness.   Gastrointestinal:  Negative for abdominal pain.  Genitourinary:  Negative for flank pain.  Musculoskeletal:  Negative for back pain.  Skin:  Negative for rash.  Neurological:  Negative for weakness.  Psychiatric/Behavioral:  Negative for confusion.    Physical Exam Updated Vital Signs BP 139/85   Pulse 86   Temp 98.2 F (36.8 C)   Resp 16   SpO2 98%   Physical Exam Vitals and nursing note reviewed.  HENT:     Head: Atraumatic.  Eyes:     Pupils: Pupils are equal, round, and reactive to light.  Cardiovascular:     Rate and Rhythm: Regular rhythm.  Pulmonary:     Breath sounds: No wheezing or rhonchi.  Comments: Mildly harsh breath sounds without focal rales or rhonchi. Abdominal:     Tenderness: There is no abdominal tenderness.  Musculoskeletal:        General: No tenderness.     Cervical back: Neck supple.  Skin:    General: Skin is warm.     Capillary Refill: Capillary refill takes less than 2 seconds.  Neurological:     Mental Status: She is alert and oriented to person, place, and time.    ED Results / Procedures / Treatments   Labs (all labs ordered are listed, but only abnormal results are displayed) Labs Reviewed - No data to display  EKG None  Radiology DG Chest 1 View  Result Date: 10/06/2020 CLINICAL DATA:  50 year old female with history of coughing congestion for the past 4 days. COVID positive patient. EXAM: CHEST  1 VIEW COMPARISON:  No priors. FINDINGS: Lung volumes are normal. No consolidative airspace disease. No pleural effusions. No pneumothorax. No pulmonary nodule or mass noted. Pulmonary vasculature and the cardiomediastinal silhouette are within normal limits.  IMPRESSION: No radiographic evidence of acute cardiopulmonary disease. Electronically Signed   By: Trudie Reed M.D.   On: 10/06/2020 09:21    Procedures Procedures   Medications Ordered in ED Medications - No data to display  ED Course  I have reviewed the triage vital signs and the nursing notes.  Pertinent labs & imaging results that were available during my care of the patient were reviewed by me and considered in my medical decision making (see chart for details).    MDM Rules/Calculators/A&P                          Patient with URI symptoms.  Cough.  This is about day 5 of symptoms.  COVID test positive at home.  Likely got from her mother.  Well-appearing.  Chest x-ray reassuring.  He is high risk due to her smoking.  Discussed with patient and she will be taking Paxlovid.  It was prescribed and patient will be discharged home.  Doubt pneumonia.  Doubt pulmonary embolism. Final Clinical Impression(s) / ED Diagnoses Final diagnoses:  COVID-19    Rx / DC Orders ED Discharge Orders          Ordered    nirmatrelvir/ritonavir EUA (PAXLOVID) 20 x 150 MG & 10 x 100MG  TABS  2 times daily        10/06/20 1042             12/06/20, MD 10/06/20 1100

## 2021-07-28 NOTE — Telephone Encounter (Signed)
error 

## 2021-11-02 IMAGING — DX DG CHEST 1V
1 series · 1 of 1 positions shown · non-contrast
Comparison: No priors.

CLINICAL DATA: 50-year-old female with history of coughing
congestion for the past 4 days. COVID positive patient.

EXAM:
CHEST  1 VIEW

[w chest pa]
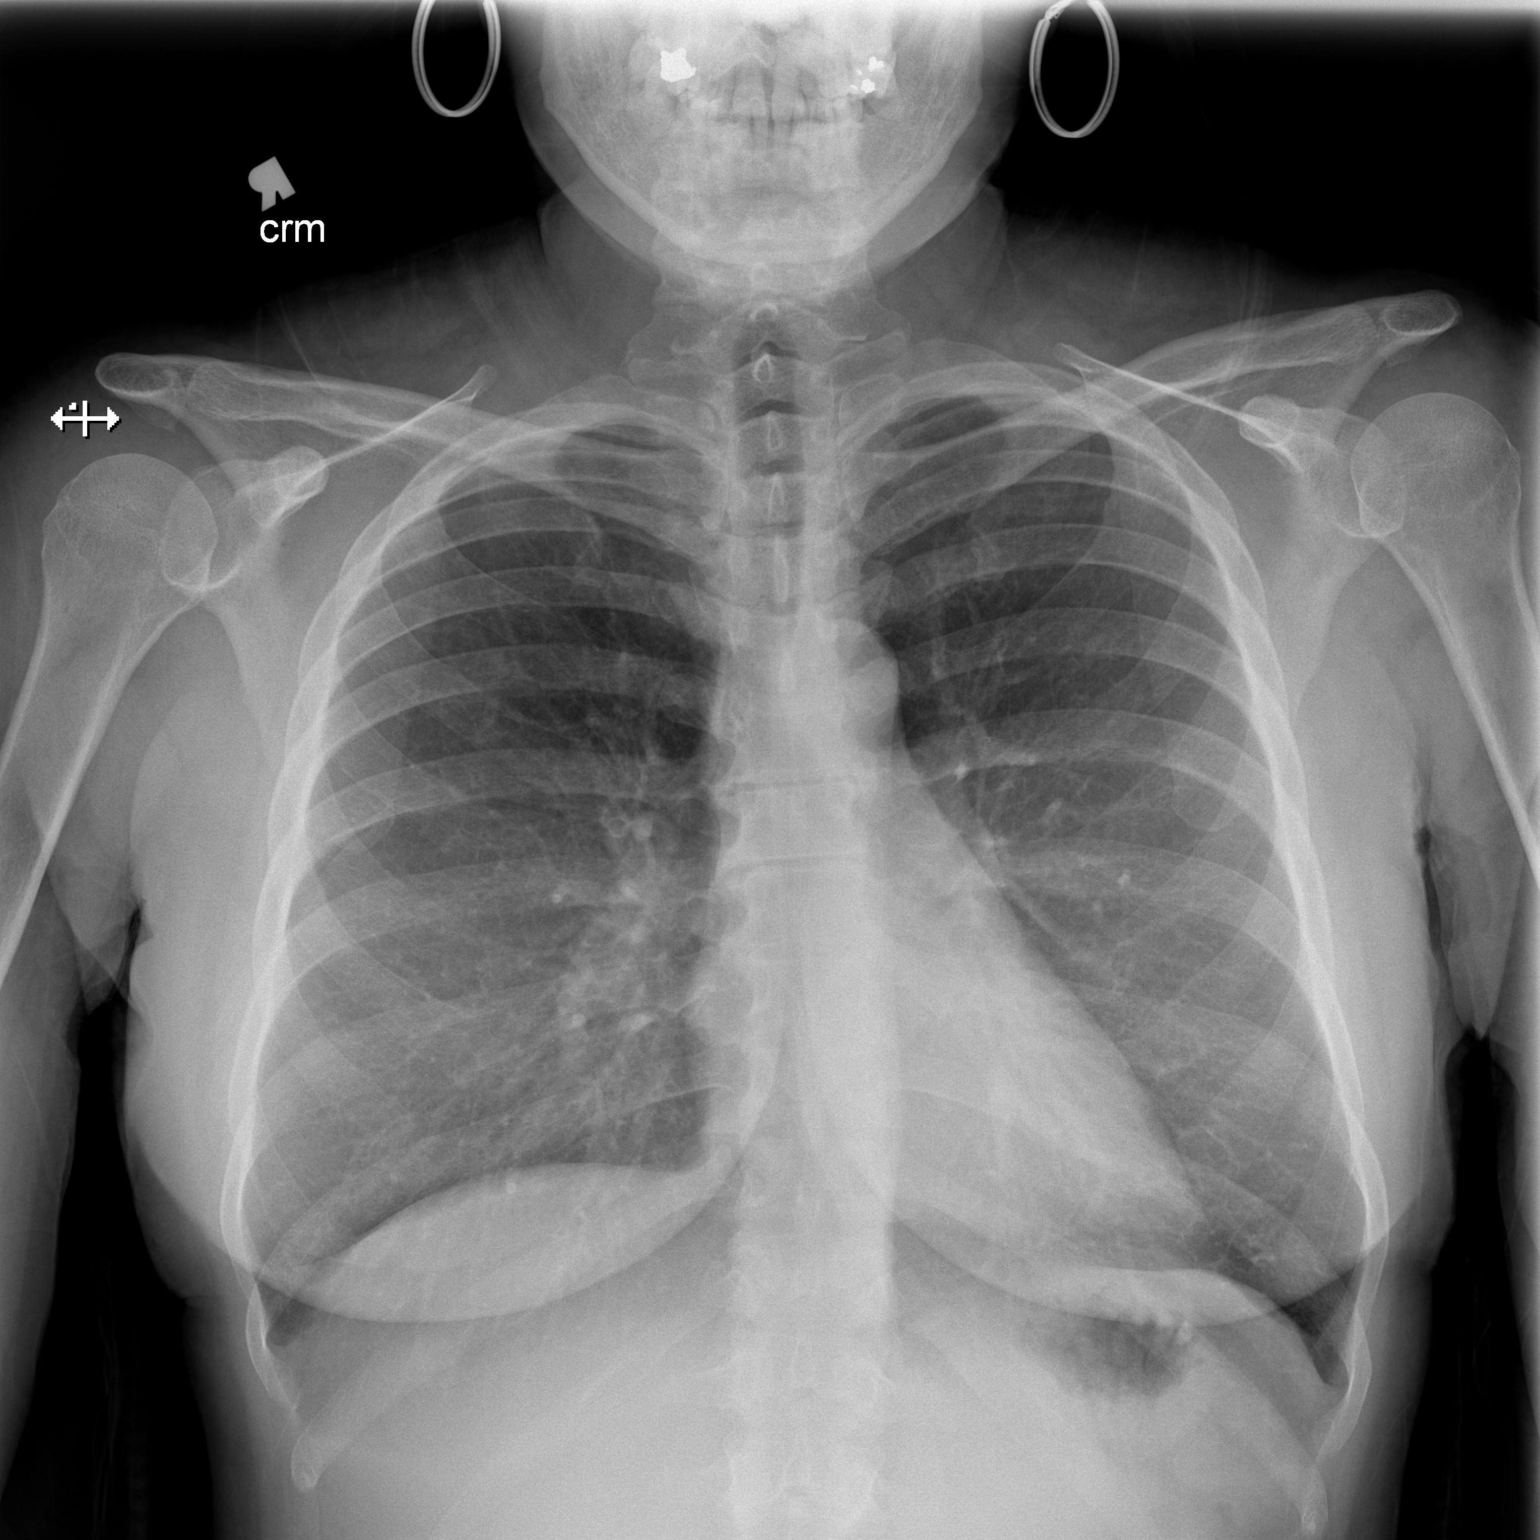

[1 of 1 positions shown; findings below may reference images not displayed]

FINDINGS: Lung volumes are normal. No consolidative airspace disease. No
pleural effusions. No pneumothorax. No pulmonary nodule or mass
noted. Pulmonary vasculature and the cardiomediastinal silhouette
are within normal limits.
IMPRESSION: No radiographic evidence of acute cardiopulmonary disease.

## 2022-11-10 ENCOUNTER — Emergency Department (HOSPITAL_COMMUNITY)
Admission: EM | Admit: 2022-11-10 | Discharge: 2022-11-10 | Discharge status: Against Medical Advice | Payer: Self-pay | Attending: Emergency Medicine | Admitting: Emergency Medicine

## 2022-11-10 ENCOUNTER — Other Ambulatory Visit: Payer: Self-pay

## 2022-11-10 ENCOUNTER — Encounter (HOSPITAL_COMMUNITY): Payer: Self-pay

## 2022-11-10 DIAGNOSIS — Z5321 Procedure and treatment not carried out due to patient leaving prior to being seen by health care provider: Secondary | ICD-10-CM | POA: Insufficient documentation

## 2022-11-10 DIAGNOSIS — M25552 Pain in left hip: Secondary | ICD-10-CM | POA: Insufficient documentation

## 2022-11-10 DIAGNOSIS — R21 Rash and other nonspecific skin eruption: Secondary | ICD-10-CM | POA: Insufficient documentation

## 2022-11-10 MED ORDER — FAMOTIDINE IN NACL 20-0.9 MG/50ML-% IV SOLN: 20.0000 mg | Freq: Once | INTRAVENOUS | Status: DC

## 2022-11-10 MED ORDER — DIPHENHYDRAMINE HCL 50 MG/ML IJ SOLN: 25.0000 mg | Freq: Once | INTRAMUSCULAR | Status: DC

## 2022-11-10 MED ORDER — METHYLPREDNISOLONE SODIUM SUCC 125 MG IJ SOLR: 125.0000 mg | Freq: Once | INTRAMUSCULAR | Status: DC

## 2022-11-10 NOTE — ED Notes (Signed)
Was advised by the triage nurse that the patient just walked out. It appears her and her daughter had an argument. I tried to call the patient on the number provided to advise her to return tot he ED. However there was no answer, Unable to leave a message.,

## 2022-11-10 NOTE — ED Provider Triage Note (Signed)
Emergency Medicine Provider Triage Evaluation Note  Peggy Mccarthy , a 52 y.o. female  was evaluated in triage.  Pt complains of right-sided hip rash.  Believes she has shingles.  Occurred approximately 5 days ago.  Put some calamine lotion on today and reported immediate rash to this area.  Review of Systems  Positive: As above Negative: As above  Physical Exam  BP 129/77   Pulse 92   Temp 98.4 F (36.9 C)   Resp 15   Ht 5' (1.524 m)   Wt 68 kg   SpO2 99%   BMI 29.29 kg/m  Gen:   Awake, no distress   Resp:  Normal effort  MSK:   Moves extremities without difficulty  Other:  Will circumscribed erythema to the right lower back radiating to the right hip.  Small vesicular lesions to the right hip overlying the ASIS consistent with shingles  Medical Decision Making  Medically screening exam initiated at 8:42 PM.  Appropriate orders placed.  Jaquanda Wickersham was informed that the remainder of the evaluation will be completed by another provider, this initial triage assessment does not replace that evaluation, and the importance of remaining in the ED until their evaluation is complete.  Workup initiated   Mora Bellman 11/10/22 2043

## 2022-11-10 NOTE — ED Notes (Signed)
Patient seen by Trinna Post PA

## 2022-11-10 NOTE — ED Triage Notes (Signed)
Patient states she feels like she is on fire. Patient has redness across her lower right hip and back. Patient thought it was shingles and used and over the counter medication. Patient states she is in  lot of pain.

## 2023-05-10 ENCOUNTER — Ambulatory Visit: Admitting: Obstetrics and Gynecology

## 2023-05-16 ENCOUNTER — Other Ambulatory Visit: Payer: Self-pay

## 2023-05-16 ENCOUNTER — Other Ambulatory Visit (HOSPITAL_COMMUNITY)
Admission: RE | Admit: 2023-05-16 | Discharge: 2023-05-16 | Disposition: A | Discharge status: Home / Self Care | Source: Ambulatory Visit | Attending: Obstetrics and Gynecology | Admitting: Obstetrics and Gynecology

## 2023-05-16 ENCOUNTER — Ambulatory Visit (INDEPENDENT_AMBULATORY_CARE_PROVIDER_SITE_OTHER): Admitting: Obstetrics and Gynecology

## 2023-05-16 VITALS — BP 121/81 | HR 103 | Ht 60.0 in | Wt 142.2 lb

## 2023-05-16 DIAGNOSIS — N951 Menopausal and female climacteric states: Secondary | ICD-10-CM | POA: Diagnosis not present

## 2023-05-16 DIAGNOSIS — Z113 Encounter for screening for infections with a predominantly sexual mode of transmission: Secondary | ICD-10-CM | POA: Diagnosis not present

## 2023-05-16 DIAGNOSIS — Z124 Encounter for screening for malignant neoplasm of cervix: Secondary | ICD-10-CM | POA: Diagnosis present

## 2023-05-16 DIAGNOSIS — Z01419 Encounter for gynecological examination (general) (routine) without abnormal findings: Secondary | ICD-10-CM | POA: Diagnosis not present

## 2023-05-16 MED ORDER — ESTRADIOL 0.1 MG/GM VA CREA: TOPICAL_CREAM | VAGINAL | 12 refills | Status: AC

## 2023-05-16 NOTE — Progress Notes (Signed)
 GYNECOLOGY VISIT  Patient name: Peggy Mccarthy MRN [id number]  Date of birth: [birth year] Chief Complaint:   vaginal dryness (After intercourse spots and feels urnin ), STI screening, and PAP  History:  Peggy Mccarthy is a 53 y.o. [id number] being seen today for vaginal dryness and requesting pap and STI testing. Having burning with intercourse and postcoital spotting.    Last seen 06/2020 for annual   Discussed the use of AI scribe software for clinical note transcription with the patient, who gave verbal consent to proceed.  History of Present Illness Peggy Mccarthy is a 53 year old female who presents with vaginal dryness and menopausal symptoms.  She experiences vaginal dryness, particularly noticeable during sexual intercourse more prominent after a three-month separation from her husband, resulting in a burning sensation due to friction and subsequent spotting of pink discharge. She is concerned about potential sexually transmitted infections (STIs) due to her husband's possible infidelity, although she does not have symptoms suggestive of an infection.  Her menstrual history includes irregular cycles recently. Prior to that, she had two episodes of bleeding six months apart, each lasting a few days, after a period of four to six months without menstruation.  She experiences menopausal symptoms such as daily hot flashes and disrupted sleep due to alternating sensations of hot and cold. She has not used any medication for these symptoms but tried gummies from [facility], which she discontinued due to lack of efficacy. She has attempted to manage vaginal dryness with KYN lubricant but continues to experience spotting after intercourse. She reports a decrease in sexual desire, attributing it to menopause.  She smokes cigarettes and is attempting to quit. No history of high blood pressure or cholesterol issues. Her last Pap smear was over five years ago, with no history of abnormal results. No  changes in her breasts or nipples. She reports worsening eyesight, which she attributes to aging.   Past Medical History:  Diagnosis Date   No pertinent past medical history     Past Surgical History:  Procedure Laterality Date   AUGMENTATION MAMMAPLASTY Bilateral 2002   CESAREAN SECTION     TUBAL LIGATION      The following portions of the patient's history were reviewed and updated as appropriate: allergies, current medications, past family history, past medical history, past social history, past surgical history and problem list.   Health Maintenance:   Last pap     Component Value Date/Time   DIAGPAP  06/26/2020 1347    - Negative for intraepithelial lesion or malignancy (NILM)   HPVHIGH Negative 06/26/2020 1347   ADEQPAP  06/26/2020 1347    Satisfactory for evaluation; transformation zone component ABSENT.     Last mammogram: 2022 BIRADS 1   Review of Systems:  Pertinent items are noted in HPI. Comprehensive review of systems was otherwise negative.   Objective:  Physical Exam BP 121/81 (BP Location: Right Arm, Patient Position: Sitting, Cuff Size: Normal)   Pulse (!) 103   Ht 5' (1.524 m)   Wt 142 lb 3.2 oz (64.5 kg)   SpO2 98%   BMI 27.77 kg/m    Physical Exam Vitals and nursing note reviewed. Exam conducted with a chaperone present.  Constitutional:      Appearance: Normal appearance.  HENT:     Head: Normocephalic and atraumatic.  Pulmonary:     Effort: Pulmonary effort is normal.     Breath sounds: Normal breath sounds.  Genitourinary:    General: Normal vulva.  Exam position: Lithotomy position.     Vagina: Normal.     Cervix: Normal.     Comments: Atrophic changes to vaginal introitus  Normal appearing atrophic cervix Normal urethral meatus Non-tender levator ani bilaterally  Skin:    General: Skin is warm and dry.  Neurological:     General: No focal deficit present.     Mental Status: She is alert.  Psychiatric:        Mood and  Affect: Mood normal.        Behavior: Behavior normal.        Thought Content: Thought content normal.        Judgment: Judgment normal.      Assessment & Plan:   Assessment and Plan Assessment & Plan Menopausal symptoms Menopausal symptoms include hot flashes, sleep disturbances, and decreased libido. Discussed HRT and alternative treatments due to increased cardiovascular risk with tobacco use. - Order cholesterol and A1c tests to assess cardiovascular risk before considering HRT. - Discuss alternative treatments for hot flashes if HRT is not suitable.  Vaginal dryness Vaginal dryness causes discomfort and spotting, consistent with menopausal changes. Discussed vaginal estrogen as an effective long-term treatment. - Provided sample of lubricant for immediate relief. - Discussed vaginal estrogen options including cream, suppository, tablet, and ring. M - Vaginal cream provided, will follow up response   Tobacco use Current tobacco use increases cardiovascular risk, complicating HRT consideration. - Encourage smoking cessation efforts.  General Health Maintenance Pap smear overdue. STI testing requested due to recent sexual activity. - Pap smear collected - Order STI testing including gonorrhea, chlamydia, HIV, syphilis, and hepatitis.  Follow-up Follow-up needed to review test results and discuss treatment options for menopausal symptoms. - Schedule follow-up appointment in approximately one month, potentially virtual, to review results and discuss treatment for hot flashes.    Routine preventative health maintenance measures emphasized.  Kiki Pelton, MD [facility], [facility]

## 2023-05-17 ENCOUNTER — Telehealth: Payer: Self-pay

## 2023-05-17 ENCOUNTER — Ambulatory Visit: Payer: Self-pay | Admitting: Obstetrics and Gynecology

## 2023-05-17 ENCOUNTER — Telehealth: Payer: Self-pay | Admitting: Family Medicine

## 2023-05-17 LAB — LIPID PANEL
Chol/HDL Ratio: 3.9 ratio (ref 0.0–4.4)
Cholesterol, Total: 225 mg/dL — ABNORMAL HIGH (ref 100–199)
HDL: 58 mg/dL (ref >39)
LDL Chol Calc (NIH): 139 mg/dL — ABNORMAL HIGH (ref 0–99)
Triglycerides: 157 mg/dL — ABNORMAL HIGH (ref 0–149)
VLDL Cholesterol Cal: 28 mg/dL (ref 5–40)

## 2023-05-17 LAB — RPR+HBSAG+HCVAB+...
HIV Screen 4th Generation wRfx: NONREACTIVE
Hep C Virus Ab: NONREACTIVE
Hepatitis B Surface Ag: NEGATIVE
RPR Ser Ql: NONREACTIVE

## 2023-05-17 LAB — HEMOGLOBIN A1C
Est. average glucose Bld gHb Est-mCnc: 100 mg/dL
Hgb A1c MFr Bld: 5.1 % (ref 4.8–5.6)

## 2023-05-17 NOTE — Telephone Encounter (Signed)
 Pt called resulting results. Attempted to contact pt regarding test results. Left VM for pt to call office back regarding results.   Arnell Lange, RN

## 2023-05-17 NOTE — Telephone Encounter (Signed)
 Patient would like a call back about her results from yesterday's visit. Her phone is broken right now so please call [phone].

## 2023-05-17 NOTE — Telephone Encounter (Signed)
 Pt contacted on another encounter   Peggy Mccarthy

## 2023-05-18 LAB — CERVICOVAGINAL ANCILLARY ONLY
Bacterial Vaginitis (gardnerella): NEGATIVE
Candida Glabrata: NEGATIVE
Candida Vaginitis: NEGATIVE
Chlamydia: NEGATIVE
Comment: NEGATIVE
Comment: NEGATIVE
Comment: NEGATIVE
Comment: NEGATIVE
Comment: NEGATIVE
Comment: NORMAL
Neisseria Gonorrhea: NEGATIVE
Trichomonas: NEGATIVE

## 2023-05-19 ENCOUNTER — Telehealth: Payer: Self-pay | Admitting: Family Medicine

## 2023-05-19 LAB — CYTOLOGY - PAP
Adequacy: ABSENT
Comment: NEGATIVE
Diagnosis: UNDETERMINED — AB
High risk HPV: NEGATIVE

## 2023-05-19 NOTE — Telephone Encounter (Signed)
 Called pt and pt informed me that she has received her test results and does not have any other questions.   Hoby Kawai,RN

## 2023-05-19 NOTE — Telephone Encounter (Signed)
 Patient still needs a call about test results.

## 2023-05-25 NOTE — Telephone Encounter (Signed)
-----   Message from [place] sent at 05/19/2023 12:36 PM EDT ----- Notify that pap shows ASCUS but negative HPV therefore repeat pap in 3 years

## 2023-05-25 NOTE — Telephone Encounter (Signed)
 Per chart review patient may have received results, but I called patient and heard message voicemail is full and cannot accept messages. Peggy Mccarthy

## 2023-05-26 ENCOUNTER — Telehealth: Payer: Self-pay

## 2023-05-26 NOTE — Telephone Encounter (Signed)
 Called Pt to go over test results, advised some abnormal cells but HPV Negative & to get repeat Pap in 3 yrs. Pt was concerned of abnormal cells, assured her everything is fine & to get pap repeated in 3 yrs. Pt verbalized understanding.

## 2023-06-01 ENCOUNTER — Ambulatory Visit
Admission: RE | Admit: 2023-06-01 | Discharge: 2023-06-01 | Discharge status: Home / Self Care | Source: Ambulatory Visit | Attending: Obstetrics and Gynecology

## 2023-06-01 DIAGNOSIS — Z01419 Encounter for gynecological examination (general) (routine) without abnormal findings: Secondary | ICD-10-CM

## 2023-06-03 ENCOUNTER — Other Ambulatory Visit: Payer: Self-pay | Admitting: Obstetrics and Gynecology

## 2023-06-03 DIAGNOSIS — R928 Other abnormal and inconclusive findings on diagnostic imaging of breast: Secondary | ICD-10-CM

## 2023-06-21 ENCOUNTER — Ambulatory Visit
Admission: RE | Admit: 2023-06-21 | Discharge: 2023-06-21 | Disposition: A | Discharge status: Home / Self Care | Source: Ambulatory Visit | Attending: Obstetrics and Gynecology | Admitting: Obstetrics and Gynecology

## 2023-06-21 ENCOUNTER — Ambulatory Visit

## 2023-06-21 DIAGNOSIS — R928 Other abnormal and inconclusive findings on diagnostic imaging of breast: Secondary | ICD-10-CM

## 2023-06-27 ENCOUNTER — Ambulatory Visit: Payer: Self-pay | Admitting: Obstetrics and Gynecology

## 2023-10-05 ENCOUNTER — Encounter: Payer: Self-pay | Admitting: Family

## 2023-10-05 ENCOUNTER — Ambulatory Visit: Admitting: Family

## 2023-10-05 VITALS — BP 139/79 | HR 89 | Temp 98.5°F | Resp 16 | Ht 60.0 in | Wt 138.4 lb

## 2023-10-05 DIAGNOSIS — F17210 Nicotine dependence, cigarettes, uncomplicated: Secondary | ICD-10-CM | POA: Diagnosis not present

## 2023-10-05 DIAGNOSIS — Z23 Encounter for immunization: Secondary | ICD-10-CM

## 2023-10-05 DIAGNOSIS — F419 Anxiety disorder, unspecified: Secondary | ICD-10-CM | POA: Diagnosis not present

## 2023-10-05 DIAGNOSIS — Z13228 Encounter for screening for other metabolic disorders: Secondary | ICD-10-CM

## 2023-10-05 DIAGNOSIS — Z130 Encounter for screening for diseases of the blood and blood-forming organs and certain disorders involving the immune mechanism: Secondary | ICD-10-CM

## 2023-10-05 DIAGNOSIS — Z131 Encounter for screening for diabetes mellitus: Secondary | ICD-10-CM

## 2023-10-05 DIAGNOSIS — F32A Depression, unspecified: Secondary | ICD-10-CM

## 2023-10-05 DIAGNOSIS — Z13220 Encounter for screening for lipoid disorders: Secondary | ICD-10-CM

## 2023-10-05 DIAGNOSIS — Z0000 Encounter for general adult medical examination without abnormal findings: Secondary | ICD-10-CM | POA: Diagnosis not present

## 2023-10-05 DIAGNOSIS — Z1329 Encounter for screening for other suspected endocrine disorder: Secondary | ICD-10-CM

## 2023-10-05 DIAGNOSIS — Z1211 Encounter for screening for malignant neoplasm of colon: Secondary | ICD-10-CM

## 2023-10-05 MED ORDER — ESCITALOPRAM OXALATE 5 MG PO TABS: 5.0000 mg | ORAL_TABLET | Freq: Every day | ORAL | 0 refills | Status: AC

## 2023-10-05 MED ORDER — HYDROXYZINE PAMOATE 25 MG PO CAPS: 25.0000 mg | ORAL_CAPSULE | Freq: Three times a day (TID) | ORAL | 1 refills | Status: AC | PRN

## 2023-10-05 NOTE — Progress Notes (Signed)
 Patient wants blood work done to be check out for everything.  Patient hands keep going numb, patient scored a 12 on the PHQ-9

## 2023-10-05 NOTE — Progress Notes (Signed)
 Patient ID: Peggy Mccarthy, female    DOB: 04/17/1970  MRN: [id number]  CC: Annual Exam  Subjective: Peggy Mccarthy is a 53 y.o. female who presents for annual exam.  Her concerns today include:  - States up to date on mammogram.  - States up to date on cervical cancer screening.  - Due for colon cancer screening. - Intermittent bilateral hand numbness. Declines pharmacological therapy. Prefers to have lab to see if she has diabetes.  - Anxiety depression. Would like to try medication to see if this helps. She denies thoughts of self-harm, suicidal ideations, homicidal ideations.   Patient Active Problem List   Diagnosis Date Noted   Spinal stenosis of lumbar region 06/16/2020   Sciatic nerve pain, right 04/01/2020     Current Outpatient Medications on File Prior to Visit  Medication Sig Dispense Refill   clindamycin (CLEOCIN) 300 MG capsule Take 300 mg by mouth every 8 (eight) hours. (Patient not taking: Reported on 10/05/2023)     estradiol  (ESTRACE ) 0.1 MG/GM vaginal cream Apply 1 gram per vagina every night for 2 weeks, then apply three times a week (Patient not taking: Reported on 10/05/2023) 30 g 12   No current facility-administered medications on file prior to visit.    Allergies  Allergen Reactions   Aspirin Anaphylaxis, Hives and Swelling    Social History   Socioeconomic History   Marital status: Married    Spouse name: Not on file   Number of children: Not on file   Years of education: Not on file   Highest education level: Not on file  Occupational History   Not on file  Tobacco Use   Smoking status: Every Day    Types: Cigarettes   Smokeless tobacco: Never  Vaping Use   Vaping status: Never Used  Substance and Sexual Activity   Alcohol use: Yes    Alcohol/week: 4.0 standard drinks of alcohol    Types: 4 Standard drinks or equivalent per week   Drug use: Never   Sexual activity: Yes    Birth control/protection: None  Other Topics Concern    Not on file  Social History Narrative   Not on file   Social Drivers of Health   Financial Resource Strain: Low Risk  (10/05/2023)   Overall Financial Resource Strain (CARDIA)    Difficulty of Paying Living Expenses: Not hard at all  Food Insecurity: No Food Insecurity (10/05/2023)   Hunger Vital Sign    Worried About Running Out of Food in the Last Year: Never true    Ran Out of Food in the Last Year: Never true  Transportation Needs: No Transportation Needs (10/05/2023)   PRAPARE - Administrator, Civil Service (Medical): No    Lack of Transportation (Non-Medical): No  Physical Activity: Sufficiently Active (10/05/2023)   Exercise Vital Sign    Days of Exercise per Week: 7 days    Minutes of Exercise per Session: 30 min  Stress: No Stress Concern Present (10/05/2023)   [employer] of Occupational Health - Occupational Stress Questionnaire    Feeling of Stress: Only a little  Social Connections: Socially Isolated (10/05/2023)   Social Connection and Isolation Panel    Frequency of Communication with Friends and Family: More than three times a week    Frequency of Social Gatherings with Friends and Family: More than three times a week    Attends Religious Services: Never    Database administrator or Organizations: No  Attends Banker Meetings: Never    Marital Status: Never married  Intimate Partner Violence: Not At Risk (10/05/2023)   Humiliation, Afraid, Rape, and Kick questionnaire    Fear of Current or Ex-Partner: No    Emotionally Abused: No    Physically Abused: No    Sexually Abused: No    Family History  Problem Relation Age of Onset   Breast cancer Maternal Aunt     Past Surgical History:  Procedure Laterality Date   AUGMENTATION MAMMAPLASTY Bilateral 2002   CESAREAN SECTION     TUBAL LIGATION      ROS: Review of Systems Negative except as stated above  PHYSICAL EXAM: BP 139/79   Pulse 89   Temp 98.5 F (36.9 C) (Oral)    Resp 16   Ht 5' (1.524 m)   Wt 138 lb 6.4 oz (62.8 kg)   SpO2 96%   BMI 27.03 kg/m   Physical Exam HENT:     Head: Normocephalic and atraumatic.     Right Ear: Tympanic membrane, ear canal and external ear normal.     Left Ear: Tympanic membrane, ear canal and external ear normal.     Nose: Nose normal.     Mouth/Throat:     Mouth: Mucous membranes are moist.     Pharynx: Oropharynx is clear.  Eyes:     Extraocular Movements: Extraocular movements intact.     Conjunctiva/sclera: Conjunctivae normal.     Pupils: Pupils are equal, round, and reactive to light.  Neck:     Thyroid: No thyroid mass, thyromegaly or thyroid tenderness.  Cardiovascular:     Rate and Rhythm: Normal rate and regular rhythm.     Pulses: Normal pulses.     Heart sounds: Normal heart sounds.  Pulmonary:     Effort: Pulmonary effort is normal.     Breath sounds: Normal breath sounds.  Chest:     Comments: Patient declined. Abdominal:     General: Bowel sounds are normal.     Palpations: Abdomen is soft.  Genitourinary:    Comments: Patient declined. Musculoskeletal:        General: Normal range of motion.     Right shoulder: Normal.     Left shoulder: Normal.     Right upper arm: Normal.     Left upper arm: Normal.     Right elbow: Normal.     Left elbow: Normal.     Right forearm: Normal.     Left forearm: Normal.     Right wrist: Normal.     Left wrist: Normal.     Right hand: Normal.     Left hand: Normal.     Cervical back: Normal, normal range of motion and neck supple.     Thoracic back: Normal.     Lumbar back: Normal.     Right hip: Normal.     Left hip: Normal.     Right upper leg: Normal.     Left upper leg: Normal.     Right knee: Normal.     Left knee: Normal.     Right lower leg: Normal.     Left lower leg: Normal.     Right ankle: Normal.     Left ankle: Normal.     Right foot: Normal.     Left foot: Normal.  Skin:    General: Skin is warm and dry.     Capillary  Refill: Capillary refill takes less than 2 seconds.  Neurological:     General: No focal deficit present.     Mental Status: She is alert and oriented to person, place, and time.  Psychiatric:        Mood and Affect: Mood normal.        Behavior: Behavior normal.    ASSESSMENT AND PLAN: 1. Annual physical exam (Primary) - Counseled on 150 minutes of exercise per week as tolerated, healthy eating (including decreased daily intake of saturated fats, cholesterol, added sugars, sodium), STI prevention, and routine healthcare maintenance.  2. Screening for metabolic disorder - Routine screening.  - CMP14+EGFR  3. Screening for deficiency anemia - Routine screening.  - CBC  4. Diabetes mellitus screening - Routine screening.  - Hemoglobin A1c  5. Screening cholesterol level - Routine screening.  - Lipid panel  6. Thyroid disorder screen - Routine screening.  - TSH  7. Colon cancer screening - Referral to Gastroenterology for colon cancer screening by colonoscopy. - Ambulatory referral to Gastroenterology  8. Anxiety and depression - Patient denies thoughts of self-harm, suicidal ideations, homicidal ideations. - Escitalopram and Hydroxyzine as prescribed. Counseled on medication adherence/adverse effects.  - Patient declined referral to Psychiatry.  - Follow-up with primary provider in 4 weeks or sooner if needed.  - escitalopram (LEXAPRO) 5 MG tablet; Take 1 tablet (5 mg total) by mouth daily.  Dispense: 90 tablet; Refill: 0 - hydrOXYzine (VISTARIL) 25 MG capsule; Take 1 capsule (25 mg total) by mouth every 8 (eight) hours as needed.  Dispense: 90 capsule; Refill: 1  9. Immunization due - Administered.  - Tdap vaccine greater than or equal to 7yo IM - Varicella-zoster vaccine IM   Patient was given the opportunity to ask questions.  Patient verbalized understanding of the plan and was able to repeat key elements of the plan. Patient was given clear instructions to go to  Emergency Department or return to medical center if symptoms don't improve, worsen, or new problems develop.The patient verbalized understanding.   Orders Placed This Encounter  Procedures   Tdap vaccine greater than or equal to 7yo IM   Varicella-zoster vaccine IM   CBC   Lipid panel   CMP14+EGFR   Hemoglobin A1c   TSH   Ambulatory referral to Gastroenterology     Requested Prescriptions   Signed Prescriptions Disp Refills   escitalopram (LEXAPRO) 5 MG tablet 90 tablet 0    Sig: Take 1 tablet (5 mg total) by mouth daily.   hydrOXYzine (VISTARIL) 25 MG capsule 90 capsule 1    Sig: Take 1 capsule (25 mg total) by mouth every 8 (eight) hours as needed.    Return in about 1 year (around 10/04/2024) for Physical per patient preference and 4 weeks chronic conditions .  Greig JINNY Drones, NP

## 2023-10-06 ENCOUNTER — Telehealth: Payer: Self-pay

## 2023-10-06 LAB — CBC
Hematocrit: 38.6 % (ref 34.0–46.6)
Hemoglobin: 13.4 g/dL (ref 11.1–15.9)
MCH: 37.3 pg — ABNORMAL HIGH (ref 26.6–33.0)
MCHC: 34.7 g/dL (ref 31.5–35.7)
MCV: 108 fL — ABNORMAL HIGH (ref 79–97)
Platelets: 294 x10E3/uL (ref 150–450)
RBC: 3.59 x10E6/uL — ABNORMAL LOW (ref 3.77–5.28)
RDW: 13.6 % (ref 11.7–15.4)
WBC: 6.4 x10E3/uL (ref 3.4–10.8)

## 2023-10-06 LAB — CMP14+EGFR
ALT: 113 IU/L — ABNORMAL HIGH (ref 0–32)
AST: 267 IU/L — ABNORMAL HIGH (ref 0–40)
Albumin: 4.4 g/dL (ref 3.8–4.9)
Alkaline Phosphatase: 130 IU/L (ref 49–135)
BUN/Creatinine Ratio: 19 (ref 9–23)
BUN: 11 mg/dL (ref 6–24)
Bilirubin Total: 0.6 mg/dL (ref 0.0–1.2)
CO2: 23 mmol/L (ref 20–29)
Calcium: 9.3 mg/dL (ref 8.7–10.2)
Chloride: 95 mmol/L — ABNORMAL LOW (ref 96–106)
Creatinine, Ser: 0.58 mg/dL (ref 0.57–1.00)
Globulin, Total: 3.2 g/dL (ref 1.5–4.5)
Glucose: 73 mg/dL (ref 70–99)
Potassium: 3.8 mmol/L (ref 3.5–5.2)
Sodium: 138 mmol/L (ref 134–144)
Total Protein: 7.6 g/dL (ref 6.0–8.5)
eGFR: 108 mL/min/1.73 (ref >59)

## 2023-10-06 LAB — LIPID PANEL
Chol/HDL Ratio: 4 ratio (ref 0.0–4.4)
Cholesterol, Total: 260 mg/dL — ABNORMAL HIGH (ref 100–199)
HDL: 65 mg/dL (ref >39)
LDL Chol Calc (NIH): 176 mg/dL — ABNORMAL HIGH (ref 0–99)
Triglycerides: 110 mg/dL (ref 0–149)
VLDL Cholesterol Cal: 19 mg/dL (ref 5–40)

## 2023-10-06 LAB — TSH: TSH: 1.3 u[IU]/mL (ref 0.450–4.500)

## 2023-10-06 LAB — HEMOGLOBIN A1C
Est. average glucose Bld gHb Est-mCnc: 103 mg/dL
Hgb A1c MFr Bld: 5.2 % (ref 4.8–5.6)

## 2023-10-06 NOTE — Telephone Encounter (Signed)
 Copied from CRM [id number]. Topic: Clinical - Lab/Test Results >> Oct 06, 2023 11:25 AM Amy B wrote: Reason for CRM: Patient is requesting a call back to discuss her lab results.  She has several questions.

## 2023-10-06 NOTE — Telephone Encounter (Signed)
 Labs results have not been viewed by PCP. When reviewed patient will be notified. My chart message sent to patient.

## 2023-10-07 ENCOUNTER — Ambulatory Visit: Payer: Self-pay | Admitting: Family Medicine
